# Patient Record
Sex: Female | Born: 1960
Health system: Southern US, Community
[De-identification: ages and names within clinical notes are randomized; demographics above are authoritative.]

## PROBLEM LIST (undated history)

## (undated) DIAGNOSIS — R002 Palpitations: Secondary | ICD-10-CM

## (undated) DIAGNOSIS — IMO0001 Reserved for inherently not codable concepts without codable children: Secondary | ICD-10-CM

## (undated) DIAGNOSIS — I7300 Raynaud's syndrome without gangrene: Secondary | ICD-10-CM

## (undated) HISTORY — DX: Reserved for inherently not codable concepts without codable children: IMO0001

## (undated) HISTORY — PX: OTHER SURGICAL HISTORY: SHX169

## (undated) HISTORY — DX: Palpitations: R00.2

## (undated) HISTORY — DX: Raynaud's syndrome without gangrene: I73.00

---

## 1997-06-05 ENCOUNTER — Ambulatory Visit (HOSPITAL_COMMUNITY): Admission: RE | Admit: 1997-06-05 | Discharge: 1997-06-05 | Payer: Self-pay | Admitting: Internal Medicine

## 1997-10-16 ENCOUNTER — Ambulatory Visit: Admission: RE | Admit: 1997-10-16 | Discharge: 1997-10-16 | Payer: Self-pay | Admitting: Internal Medicine

## 1999-06-16 ENCOUNTER — Other Ambulatory Visit: Admission: RE | Admit: 1999-06-16 | Discharge: 1999-06-16 | Payer: Self-pay | Admitting: Internal Medicine

## 1999-09-20 ENCOUNTER — Inpatient Hospital Stay (HOSPITAL_COMMUNITY): Admission: EM | Admit: 1999-09-20 | Discharge: 1999-09-23 | Payer: Self-pay | Admitting: Emergency Medicine

## 2000-11-26 ENCOUNTER — Other Ambulatory Visit: Admission: RE | Admit: 2000-11-26 | Discharge: 2000-11-26 | Payer: Self-pay | Admitting: Internal Medicine

## 2001-09-29 ENCOUNTER — Ambulatory Visit (HOSPITAL_COMMUNITY): Admission: RE | Admit: 2001-09-29 | Discharge: 2001-09-29 | Payer: Self-pay | Admitting: Internal Medicine

## 2001-09-29 ENCOUNTER — Encounter: Payer: Self-pay | Admitting: Internal Medicine

## 2001-12-06 ENCOUNTER — Other Ambulatory Visit: Admission: RE | Admit: 2001-12-06 | Discharge: 2001-12-06 | Payer: Self-pay | Admitting: Internal Medicine

## 2001-12-13 ENCOUNTER — Encounter: Admission: RE | Admit: 2001-12-13 | Discharge: 2001-12-13 | Payer: Self-pay | Admitting: Internal Medicine

## 2001-12-13 ENCOUNTER — Encounter: Payer: Self-pay | Admitting: Internal Medicine

## 2003-05-18 ENCOUNTER — Inpatient Hospital Stay (HOSPITAL_COMMUNITY): Admission: EM | Admit: 2003-05-18 | Discharge: 2003-05-20 | Payer: Self-pay | Admitting: *Deleted

## 2003-05-18 ENCOUNTER — Encounter: Payer: Self-pay | Admitting: Cardiology

## 2003-07-12 ENCOUNTER — Encounter: Admission: RE | Admit: 2003-07-12 | Discharge: 2003-07-12 | Payer: Self-pay | Admitting: Internal Medicine

## 2004-03-04 ENCOUNTER — Ambulatory Visit: Payer: Self-pay | Admitting: Family Medicine

## 2004-03-19 ENCOUNTER — Ambulatory Visit: Payer: Self-pay

## 2004-04-14 ENCOUNTER — Ambulatory Visit: Payer: Self-pay

## 2004-08-18 ENCOUNTER — Ambulatory Visit: Payer: Self-pay | Admitting: Internal Medicine

## 2004-09-02 ENCOUNTER — Other Ambulatory Visit: Admission: RE | Admit: 2004-09-02 | Discharge: 2004-09-02 | Payer: Self-pay | Admitting: Internal Medicine

## 2004-09-02 ENCOUNTER — Ambulatory Visit: Payer: Self-pay | Admitting: Internal Medicine

## 2004-10-09 ENCOUNTER — Encounter: Admission: RE | Admit: 2004-10-09 | Discharge: 2004-10-09 | Payer: Self-pay | Admitting: Internal Medicine

## 2004-12-29 ENCOUNTER — Ambulatory Visit: Payer: Self-pay | Admitting: Internal Medicine

## 2005-09-21 ENCOUNTER — Ambulatory Visit: Payer: Self-pay | Admitting: Family Medicine

## 2005-09-25 ENCOUNTER — Ambulatory Visit: Payer: Self-pay | Admitting: Family Medicine

## 2005-10-27 ENCOUNTER — Encounter: Admission: RE | Admit: 2005-10-27 | Discharge: 2005-10-27 | Payer: Self-pay | Admitting: Internal Medicine

## 2005-11-22 IMAGING — CT CT EXTREM LOW BILAT W/ CM
2 of 4 series · 8 of 14 positions shown, 10 images · IV contrast (120 ML OMNI)
Comparison: none

CLINICAL DATA: Chest pain.  Chest tightness.  Presyncopal episode. 
 CT SCAN OF THE CHEST WITH INTRAVENOUS CONTRAST 05/18/03
 Scans were performed following intravenous injection of 120 cc of Omnipaque 300.  
 There is no evidence of pulmonary emboli, infiltrates, effusions, or adenopathy.  The heart size is normal.  No discrete bony abnormality. 
 IMPRESSION
 Negative CT scan of the chest. 
 CT SCAN OF THE LOWER EXTREMITIES WITH CONTRAST 05/18/03
 There is no evidence of deep venous thrombosis or other significant abnormality.  
 Negative CT scan of the lower extremities.

[Series 3: pe w/ lower ext · axial · 0.70mm/px · z∈[-223,-36]mm · 6 of 210 slices shown, 8 images]
[im 30/210  soft-tissue]
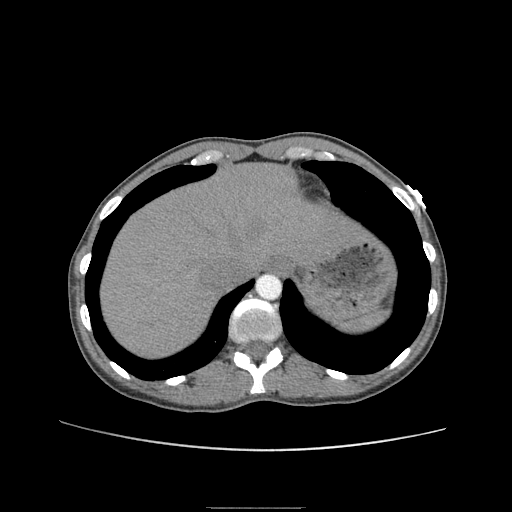
[im 30/210  bone]
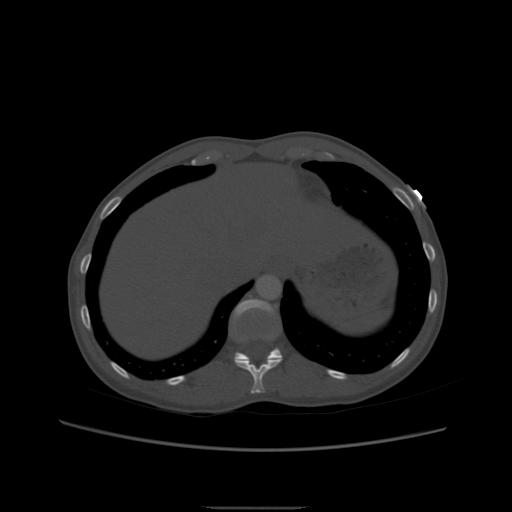
[im 60/210  bone]
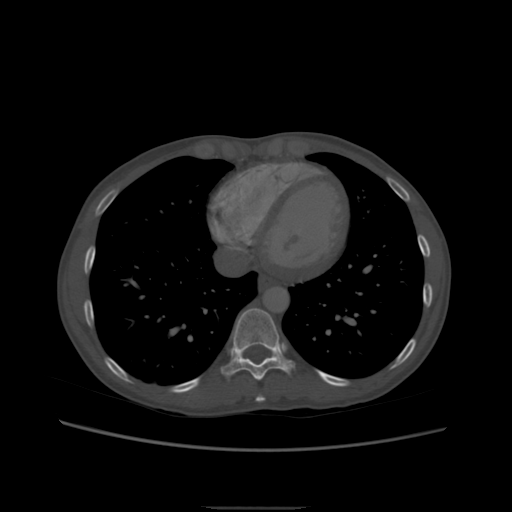
[im 90/210  bone]
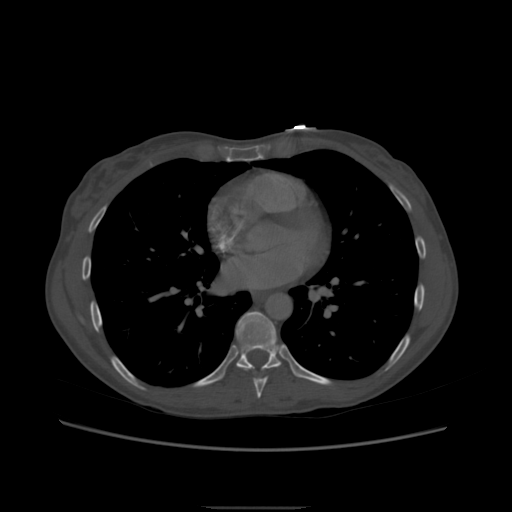
[im 120/210  bone]
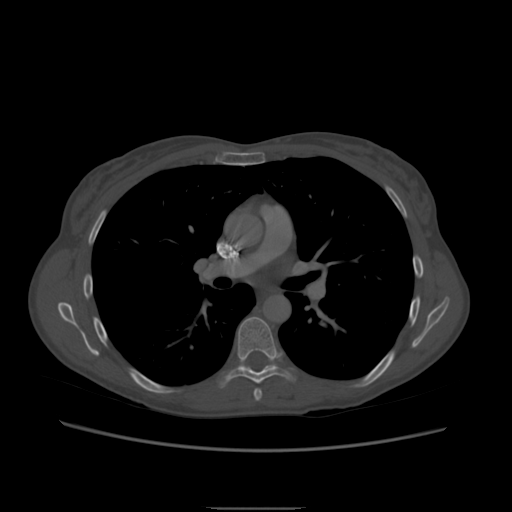
[im 150/210  soft-tissue]
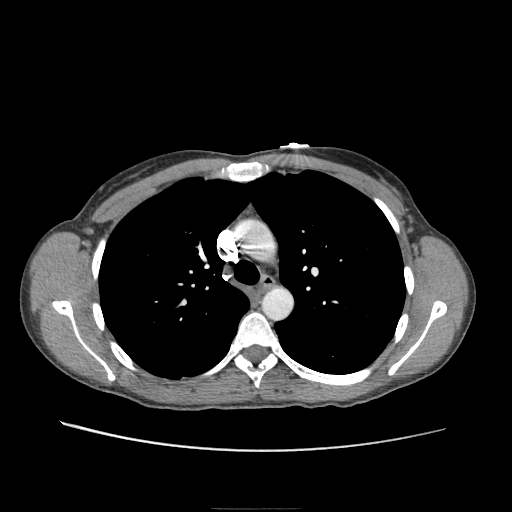
[im 150/210  bone]
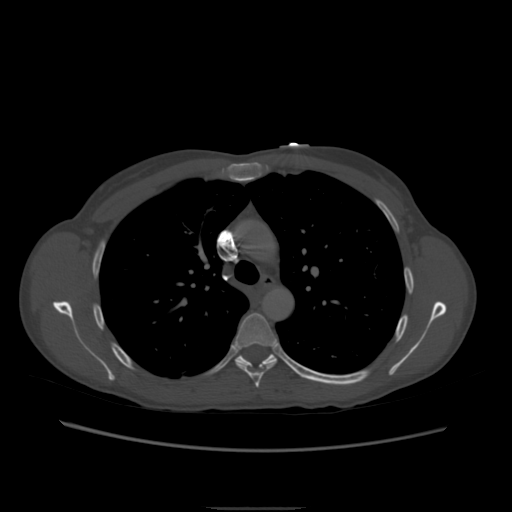
[im 180/210  bone]
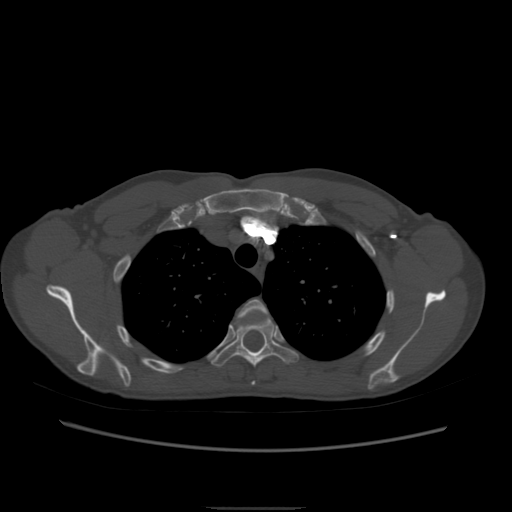

[Series 4: recon 2: pe w/ lower ext · axial · 0.70mm/px · z∈[-173,-86]mm · 2 of 105 slices shown]
[im 35/105  bone]
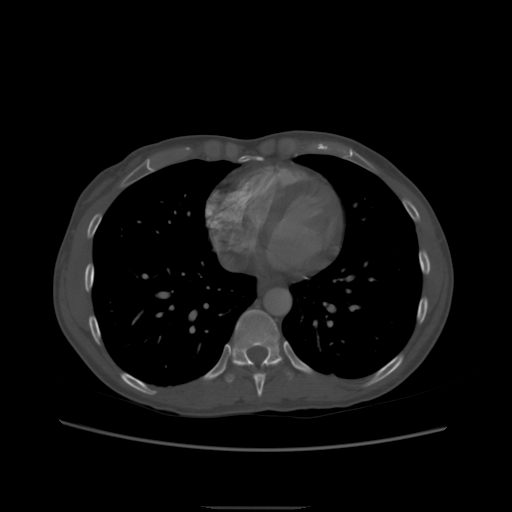
[im 70/105  bone]
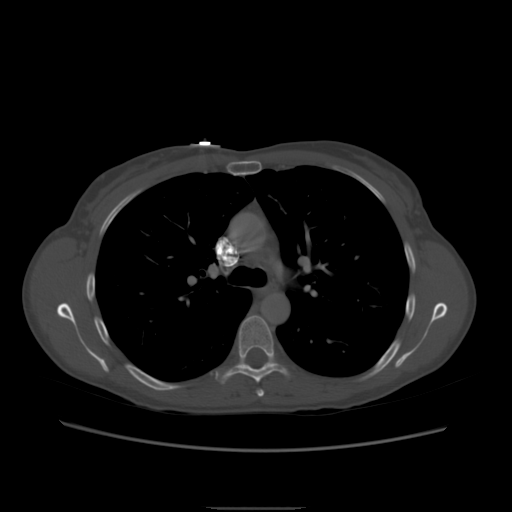

[8 of 14 positions shown; findings below may reference images not displayed]

## 2006-03-31 ENCOUNTER — Ambulatory Visit: Payer: Self-pay | Admitting: Internal Medicine

## 2006-03-31 LAB — CONVERTED CEMR LAB
AST: 24 units/L (ref 0–37)
Alkaline Phosphatase: 47 units/L (ref 39–117)
BUN: 14 mg/dL (ref 6–23)
Calcium: 9.2 mg/dL (ref 8.4–10.5)
Chloride: 108 meq/L (ref 96–112)
Creatinine, Ser: 0.7 mg/dL (ref 0.4–1.2)
Eosinophils Relative: 2.9 % (ref 0.0–5.0)
GFR calc Af Amer: 116 mL/min
HCT: 36.9 % (ref 36.0–46.0)
HDL: 75.4 mg/dL (ref >39.0)
Hemoglobin: 12.6 g/dL (ref 12.0–15.0)
LDL Cholesterol: 103 mg/dL — ABNORMAL HIGH (ref 0–99)
Lymphocytes Relative: 29.4 % (ref 12.0–46.0)
Neutro Abs: 2.2 10*3/uL (ref 1.4–7.7)
Neutrophils Relative %: 54.5 % (ref 43.0–77.0)
Potassium: 4 meq/L (ref 3.5–5.1)
RBC: 4.36 M/uL (ref 3.87–5.11)
Sodium: 140 meq/L (ref 135–145)
TSH: 0.83 microintl units/mL (ref 0.35–5.50)
Total Bilirubin: 0.9 mg/dL (ref 0.3–1.2)
Total CHOL/HDL Ratio: 2.5
Total Protein: 6.3 g/dL (ref 6.0–8.3)
Triglycerides: 54 mg/dL (ref 0–149)
VLDL: 11 mg/dL (ref 0–40)

## 2006-04-28 ENCOUNTER — Ambulatory Visit: Payer: Self-pay | Admitting: Internal Medicine

## 2006-04-28 ENCOUNTER — Encounter: Payer: Self-pay | Admitting: Internal Medicine

## 2006-04-28 ENCOUNTER — Other Ambulatory Visit: Admission: RE | Admit: 2006-04-28 | Discharge: 2006-04-28 | Payer: Self-pay | Admitting: Internal Medicine

## 2006-10-21 DIAGNOSIS — Z860100 Personal history of colon polyps, unspecified: Secondary | ICD-10-CM | POA: Insufficient documentation

## 2006-12-13 ENCOUNTER — Encounter: Admission: RE | Admit: 2006-12-13 | Discharge: 2006-12-13 | Payer: Self-pay | Admitting: Internal Medicine

## 2007-06-13 ENCOUNTER — Ambulatory Visit: Payer: Self-pay | Admitting: Internal Medicine

## 2007-06-13 DIAGNOSIS — L0100 Impetigo, unspecified: Secondary | ICD-10-CM | POA: Insufficient documentation

## 2007-06-13 DIAGNOSIS — L0889 Other specified local infections of the skin and subcutaneous tissue: Secondary | ICD-10-CM | POA: Insufficient documentation

## 2007-06-13 DIAGNOSIS — L255 Unspecified contact dermatitis due to plants, except food: Secondary | ICD-10-CM | POA: Insufficient documentation

## 2007-06-14 ENCOUNTER — Encounter: Payer: Self-pay | Admitting: Internal Medicine

## 2007-06-16 ENCOUNTER — Telehealth: Payer: Self-pay | Admitting: Internal Medicine

## 2007-11-25 ENCOUNTER — Ambulatory Visit: Payer: Self-pay | Admitting: Family Medicine

## 2007-11-25 DIAGNOSIS — R079 Chest pain, unspecified: Secondary | ICD-10-CM | POA: Insufficient documentation

## 2007-11-28 ENCOUNTER — Telehealth: Payer: Self-pay | Admitting: Internal Medicine

## 2007-11-30 ENCOUNTER — Telehealth: Payer: Self-pay | Admitting: Internal Medicine

## 2007-12-09 ENCOUNTER — Telehealth: Payer: Self-pay | Admitting: Family Medicine

## 2007-12-12 ENCOUNTER — Telehealth: Payer: Self-pay | Admitting: *Deleted

## 2007-12-20 ENCOUNTER — Ambulatory Visit: Payer: Self-pay | Admitting: Cardiology

## 2007-12-28 ENCOUNTER — Ambulatory Visit: Payer: Self-pay

## 2007-12-28 ENCOUNTER — Ambulatory Visit: Payer: Self-pay | Admitting: Cardiology

## 2007-12-28 LAB — CONVERTED CEMR LAB
LDL Cholesterol: 108 mg/dL — ABNORMAL HIGH (ref 0–99)
Triglycerides: 39 mg/dL (ref 0–149)
VLDL: 8 mg/dL (ref 0–40)

## 2008-04-05 ENCOUNTER — Encounter: Admission: RE | Admit: 2008-04-05 | Discharge: 2008-04-05 | Payer: Self-pay | Admitting: Internal Medicine

## 2008-05-14 ENCOUNTER — Other Ambulatory Visit: Admission: RE | Admit: 2008-05-14 | Discharge: 2008-05-14 | Payer: Self-pay | Admitting: Family Medicine

## 2009-05-07 ENCOUNTER — Encounter: Admission: RE | Admit: 2009-05-07 | Discharge: 2009-05-07 | Payer: Self-pay | Admitting: Family Medicine

## 2010-02-19 ENCOUNTER — Encounter (INDEPENDENT_AMBULATORY_CARE_PROVIDER_SITE_OTHER): Payer: Self-pay | Admitting: *Deleted

## 2010-03-02 ENCOUNTER — Encounter: Payer: Self-pay | Admitting: Internal Medicine

## 2010-03-09 LAB — CONVERTED CEMR LAB
ALT: 18 units/L (ref 0–35)
AST: 24 units/L (ref 0–37)
Albumin: 4.2 g/dL (ref 3.5–5.2)
Alkaline Phosphatase: 48 units/L (ref 39–117)
Basophils Absolute: 0 10*3/uL (ref 0.0–0.1)
Basophils Relative: 0.5 % (ref 0.0–3.0)
Calcium: 9.5 mg/dL (ref 8.4–10.5)
Glucose, Bld: 85 mg/dL (ref 70–99)
Hemoglobin: 13.4 g/dL (ref 12.0–15.0)
Lymphocytes Relative: 20.7 % (ref 12.0–46.0)
MCHC: 34.2 g/dL (ref 30.0–36.0)
MCV: 88.4 fL (ref 78.0–100.0)
Monocytes Relative: 12.9 % — ABNORMAL HIGH (ref 3.0–12.0)
Platelets: 226 10*3/uL (ref 150–400)
Potassium: 4.9 meq/L (ref 3.5–5.1)
Sodium: 142 meq/L (ref 135–145)
TSH: 0.55 microintl units/mL (ref 0.35–5.50)
WBC: 4 10*3/uL — ABNORMAL LOW (ref 4.5–10.5)

## 2010-03-13 NOTE — Letter (Signed)
Summary: Pre Visit Letter Revised  [facility]  [address] [place], [place] [address]   Phone: [phone]  Fax: [phone]        02/19/2010 MRN: [id number] [facility] [address] [place], [place]  [address]             Procedure Date:  03-26-10   Welcome to the Gastroenterology Division at [facility].    You are scheduled to see a nurse for your pre-procedure visit on 03-12-10 at 8:00a.m. on the 3rd floor at [facility], [address]  We ask that you try to arrive at our office 15 minutes prior to your appointment time to allow for check-in.  Please take a minute to review the attached form.  If you answer "Yes" to one or more of the questions on the first page, we ask that you call the person listed at your earliest opportunity.  If you answer "No" to all of the questions, please complete the rest of the form and bring it to your appointment.    Your nurse visit will consist of discussing your medical and surgical history, your immediate family medical history, and your medications.   If you are unable to list all of your medications on the form, please bring the medication bottles to your appointment and we will list them.  We will need to be aware of both prescribed and over the counter drugs.  We will need to know exact dosage information as well.    Please be prepared to read and sign documents such as consent forms, a financial agreement, and acknowledgement forms.  If necessary, and with your consent, a friend or relative is welcome to sit-in on the nurse visit with you.  Please bring your insurance card so that we may make a copy of it.  If your insurance requires a referral to see a specialist, please bring your referral form from your primary care physician.  No co-pay is required for this nurse visit.     If you cannot keep your appointment, please call [phone] to cancel or reschedule prior to your appointment date.  This  allows [place] the opportunity to schedule an appointment for another patient in need of care.    Thank you for choosing [facility] for your medical needs.  We appreciate the opportunity to care for you.  Please visit [place] at our website  to learn more about our practice.  Sincerely, The Gastroenterology Division

## 2010-03-17 ENCOUNTER — Encounter (INDEPENDENT_AMBULATORY_CARE_PROVIDER_SITE_OTHER): Payer: Self-pay

## 2010-03-18 ENCOUNTER — Encounter: Payer: Self-pay | Admitting: Gastroenterology

## 2010-03-26 ENCOUNTER — Other Ambulatory Visit: Payer: Self-pay | Admitting: Gastroenterology

## 2010-03-27 NOTE — Miscellaneous (Signed)
Summary: Lec previsit  Clinical Lists Changes  Medications: Added new medication of MOVIPREP 100 GM  SOLR (PEG-KCL-NACL-NASULF-NA ASC-C) As per prep instructions. - Signed Rx of MOVIPREP 100 GM  SOLR (PEG-KCL-NACL-NASULF-NA ASC-C) As per prep instructions.;  #1 x 0;  Signed;  Entered by: Ulis Rias RN;  Authorized by: Meryl Dare MD Clementeen Graham;  Method used: Electronically to [facility]*, [address] Shipping/mailing, [place], [place]  [address], Ph: [phone], Fax: [phone] Observations: Added new observation of NKA: T (03/18/2010 8:42)    Prescriptions: MOVIPREP 100 GM  SOLR (PEG-KCL-NACL-NASULF-NA ASC-C) As per prep instructions.  #1 x 0   Entered by:   Ulis Rias RN   Authorized by:   Meryl Dare MD [facility]   Signed by:   Ulis Rias RN on 03/18/2010   Method used:   Electronically to        [facility]* (retail)       [address]       [address] [place]       [place], [place]  [address]       Ph: [phone]       Fax: [phone]   RxID:   [id number]

## 2010-03-27 NOTE — Letter (Signed)
Summary: [facility] Instructions  [facility]  [address] [place], [place] [address]   Phone: [phone]  Fax: [phone]       Brenda Sanders    1960-04-25    MRN: [id number]        Procedure Day Dorna Bloom:  Wednesday  04/02/10     Arrival Time:  2:00pm     Procedure Time:  3:00pm     Location of Procedure:                    _ x_  [facility] (4th Floor)                        PREPARATION FOR COLONOSCOPY WITH MOVIPREP   Starting 5 days prior to your procedure FRIDAY  02/17  do not eat nuts, seeds, popcorn, corn, beans, peas,  salads, or any raw vegetables.  Do not take any fiber supplements (e.g. Metamucil, Citrucel, and Benefiber).  THE DAY BEFORE YOUR PROCEDURE         DATE: Tuesday 2/21  1.  Drink clear liquids the entire day-NO SOLID FOOD  2.  Do not drink anything colored red or purple.  Avoid juices with pulp.  No orange juice.  3.  Drink at least 64 oz. (8 glasses) of fluid/clear liquids during the day to prevent dehydration and help the prep work efficiently.  CLEAR LIQUIDS INCLUDE: Water Jello Ice Popsicles Tea (sugar ok, no milk/cream) Powdered fruit flavored drinks Coffee (sugar ok, no milk/cream) Gatorade Juice: apple, white grape, white cranberry  Lemonade Clear bullion, consomm, broth Carbonated beverages (any kind) Strained chicken noodle soup Hard Candy                             4.  In the morning, mix first dose of MoviPrep solution:    Empty 1 Pouch A and 1 Pouch B into the disposable container    Add lukewarm drinking water to the top line of the container. Mix to dissolve    Refrigerate (mixed solution should be used within 24 hrs)  5.  Begin drinking the prep at 5:00 p.m. The MoviPrep container is divided by 4 marks.   Every 15 minutes drink the solution down to the next mark (approximately 8 oz) until the full liter is complete.   6.  Follow completed prep with 16 oz of clear liquid of your choice (Nothing  red or purple).  Continue to drink clear liquids until bedtime.  7.  Before going to bed, mix second dose of MoviPrep solution:    Empty 1 Pouch A and 1 Pouch B into the disposable container    Add lukewarm drinking water to the top line of the container. Mix to dissolve    Refrigerate  THE DAY OF YOUR PROCEDURE      DATE: Wednesday 2/22  Beginning at 10:00 a.m. (5 hours before procedure):         1. Every 15 minutes, drink the solution down to the next mark (approx 8 oz) until the full liter is complete.  2. Follow completed prep with 16 oz. of clear liquid of your choice.    3. You may drink clear liquids until 1:00pm (2 HOURS BEFORE PROCEDURE).   MEDICATION INSTRUCTIONS  Unless otherwise instructed, you should take regular prescription medications with a small sip of water   as early as possible the morning  of your procedure.         OTHER INSTRUCTIONS  You will need a responsible adult at least 51 years of age to accompany you and drive you home.   This person must remain in the waiting room during your procedure.  Wear loose fitting clothing that is easily removed.  Leave jewelry and other valuables at home.  However, you may wish to bring a book to read or  an iPod/MP3 player to listen to music as you wait for your procedure to start.  Remove all body piercing jewelry and leave at home.  Total time from sign-in until discharge is approximately 2-3 hours.  You should go home directly after your procedure and rest.  You can resume normal activities the  day after your procedure.  The day of your procedure you should not:   Drive   Make legal decisions   Operate machinery   Drink alcohol   Return to work  You will receive specific instructions about eating, activities and medications before you leave.    The above instructions have been reviewed and explained to me by   Ulis Rias RN  March 18, 2010 9:02 AM     I fully understand and can  verbalize these instructions _____________________________ Date _________

## 2010-04-02 ENCOUNTER — Encounter: Payer: Self-pay | Admitting: Gastroenterology

## 2010-04-02 ENCOUNTER — Other Ambulatory Visit (AMBULATORY_SURGERY_CENTER): Payer: Commercial Managed Care - PPO | Admitting: Gastroenterology

## 2010-04-02 DIAGNOSIS — Z1211 Encounter for screening for malignant neoplasm of colon: Secondary | ICD-10-CM

## 2010-04-08 NOTE — Miscellaneous (Signed)
Summary: RX zofran  Clinical Lists Changes  Medications: Added new medication of ZOFRAN ODT 4 MG TBDP (ONDANSETRON) 1 pill under the tongue 4 times a day  as needed for nausea. - Signed Rx of ZOFRAN ODT 4 MG TBDP (ONDANSETRON) 1 pill under the tongue 4 times a day  as needed for nausea.;  #10 x 0;  Signed;  Entered by: Sherren Kerns RN;  Authorized by: Meryl Dare MD [facility];  Method used: [facility]  [place] [address], [place], [place]  [address], Ph: [phone] or [phone], Fax: [phone] Observations: Added new observation of ALLERGY REV: Done (04/02/2010 16:29)    Prescriptions: ZOFRAN ODT 4 MG TBDP (ONDANSETRON) 1 pill under the tongue 4 times a day  as needed for nausea.  #10 x 0   Entered by:   Sherren Kerns RN   Authorized by:   Meryl Dare MD [facility]   Signed by:   Sherren Kerns RN on 04/02/2010   Method used:   [facility]  [place] [address] (retail)       [address]       [place], [place]  [address]       Ph: [phone] or [phone]       Fax: [phone]   RxID:   [id number]

## 2010-04-08 NOTE — Procedures (Signed)
Summary: Colonoscopy  Patient: Brenda Sanders Note: All result statuses are Final unless otherwise noted.  Tests: (1) Colonoscopy (COL)   COL Colonoscopy           DONE     [facility]     [address]     [place], [place]  [address]           COLONOSCOPY PROCEDURE REPORT           PATIENT:  Brenda, Sanders  MR#:  [id number]     BIRTHDATE:  [birth year], 49 yrs. old  GENDER:  female     ENDOSCOPIST:  Judie Petit T. Russella Dar, MD, [facility]     Referred by:  Merri Brunette, M.D.     PROCEDURE DATE:  04/02/2010     PROCEDURE:  Colonoscopy 19[address]     ASA CLASS:  Class I     INDICATIONS:  1) Routine Risk Screening     MEDICATIONS:   Fentanyl 100 mcg IV, Versed 10 mg IV     DESCRIPTION OF PROCEDURE:   After the risks benefits and     alternatives of the procedure were thoroughly explained, informed     consent was obtained.  Digital rectal exam was performed and     revealed no abnormalities.   The LB [id number]-[id number] endoscope     was introduced through the anus and advanced to the cecum, which     was identified by both the appendix and ileocecal valve, limited     by a tortuous colon.    The quality of the prep was excellent,     using MoviPrep.  The instrument was then slowly withdrawn as the     colon was fully examined.     <<PROCEDUREIMAGES>>     FINDINGS:  A normal appearing cecum, ileocecal valve, and     appendiceal orifice were identified. The ascending, hepatic     flexure, transverse, splenic flexure, descending, sigmoid colon,     and rectum appeared unremarkable. Retroflexed views in the rectum     revealed no abnormalities. The time to cecum =  3.25  minutes. The     scope was then withdrawn (time =  9.33  min) from the patient and     the procedure completed.           COMPLICATIONS:  None           ENDOSCOPIC IMPRESSION:     1) Normal colon           RECOMMENDATIONS:     1) Continue current colorectal screening for "routine risk"     patients with a repeat colonoscopy  in 10 years.           Venita Lick. Russella Dar, MD, [facility]een Graham           n.     eSIGNED:   Venita Lick. Parris Signer at 04/02/2010 03:02 PM           Brenda Sanders, [id number]  Note: An exclamation mark (!) indicates a result that was not dispersed into the flowsheet. Document Creation Date: 04/02/2010 3:02 PM _______________________________________________________________________  (1) Order result status: Final Collection or observation date-time: 04/02/2010 14:59 Requested date-time:  Receipt date-time:  Reported date-time:  Referring Physician:   Ordering Physician: Claudette Head [phone]) Specimen Source:  Source: Launa Grill Order Number: [id number] Lab site:   Appended Document: Colonoscopy    Clinical Lists Changes  Observations: Added new observation of COLONNXTDUE: 03/2020 (04/02/2010 16:07)

## 2010-06-24 NOTE — Procedures (Signed)
[facility]                              EXERCISE TREADMILL   Brenda Sanders, Brenda Sanders                        MRN:          [id number]  DATE:12/28/2007                            DOB:          09-09-1960    PROCEDURE:  An exercise treadmill test.   INDICATION:  Chest pain with exertion while running on the treadmill  during her usual exercise regimen.   PROCEDURE NOTE:  The patient exercised using standard Bruce protocol for  total of 14 minutes and 30 seconds.  She reached a maximum heart rate of  164 beats per minute which is 95% of the maximum predicted heart rate.  She did achieve a maximal blood pressure of 154/99.  Her baseline blood  pressure was 129/81.  She did reach stage 5 of the Bruce protocol.  Her  exercise level was 17.20 METS.  On review of the exercise EKG, there  were no ischemic ST-segment changes.  At peak exercise, she did have a  couplet of ventricular premature beats.  Otherwise, there were no  arrhythmias noted.   IMPRESSION:  This is a normal exercise treadmill test.  The patient has  excellent exercise tolerance.  I do not believe that her chest pain was  related to coronary artery disease.  She will follow up as regularly  scheduled with Dr. Fabian Sharp and we will call her with her cholesterol  results.     Marca Ancona, MD  Electronically Signed    DM/MedQ  DD: 12/28/2007  DT: 12/29/2007  Job #: [id number]   cc:   Neta Mends. Fabian Sharp, MD

## 2010-06-24 NOTE — Assessment & Plan Note (Signed)
[facility]                            CARDIOLOGY OFFICE NOTE   Brenda Sanders, Brenda Sanders                        MRN:          [id number]  DATE:12/20/2007                            DOB:          [birth year]    PRIMARY CARE PHYSICIAN:  Neta Mends. Panosh, MD   HISTORY OF PRESENT ILLNESS:  This is a 50 year old with no significant  past medical history, who presents to Cardiology Clinic for evaluation  of chest pain with exertion.  The patient states that 2 weeks ago, she  was running on a treadmill.  She says she was going faster and the heart  at the normal and developed substernal chest pain that resolved when she  got off the treadmill and rested.  The next day, she was riding an  exercise bicycle and was pushing herself hard and she developed a  substernal chest pain that resolved when she stopped the bicycle and  rested.  The following day after that she was running in the park, she  was going quite fast, she developed substernal chest pain and which  resolved when she rested.  She had never had substernal chest pain  before and since that time, she has had no further episodes.  She is  continued to exercise, although she is not sure if she exercised as  intensely as she was on those 3 days that she had the chest discomfort.  She denies any trauma to the chest area, there is no pull of any muscle.  The patient is a nonsmoker, has no history of hypercholesterolemia or  hypertension.  No family history of early onset coronary artery disease.  Back in 2005, the patient was worked up for an episode of presyncope.  She was driving in her car and developed what she felt it was an  irregular heart rhythm and became lightheaded.  She was admitted, had an  echocardiogram done that was unremarkable.  She had a Holter monitor  done that was negative and also she had an exercise Myoview that showed  normal perfusion with no perfusion defects, but latter the exercise  Myoview was actually done in 2006.  The patient is not postmenopausal.  She has had no recent palpitations.  She states she received a flu shot  this year and shortly after the flu shot, she developed feeling of  nausea and achiness and felt lightheaded.  She felt like her heart rate  was running high in the 110s to 120s where she took her pulse.  This  resolved on its own and has not come back again.   PAST MEDICAL HISTORY:  1. Echocardiogram April 2005, EF of 65%, no significant valvular      abnormalities.  2. Holter monitor April 2005 negative for any significant findings.  3. Exercise Myoview in February 2006 was normal with no evidence for      ischemia or infarction.   MEDICATIONS:  Multivitamin and calcium.   SOCIAL HISTORY:  The patient works, the patient is a Engineer, civil (consulting) at [employer].  She is married.  She is not  smoking or use drugs.  She rarely  drinks a glass of wine.  She exercises regularly.   FAMILY HISTORY:  The patient's grandfather had congestive heart failure  and had a myocardial infarction at 61.  Her parents are healthy and her  siblings are healthy.   REVIEW OF SYSTEMS:  Negative except as noted in history of present  illness.   EKG today showed normal sinus rhythm, and is normal EKG.   Labs from October 2009, creatinine 0.8.  LFTs normal.  Hematocrit 39.1.  TSH normal.  LDL from February 2008 is 045.   PHYSICAL EXAMINATION:  VITAL SIGNS:  Blood pressure 100/70, heart rate  62 and regular, and weight is 125 pounds.  GENERAL:  This is a well-developed female in no apparent distress.  HEENT:  Normal.  NEUROLOGIC:  Alert and oriented x3.  Normal affect.  NECK:  There is no thyromegaly.  No thyroid nodule.  There is no JVD.  LUNGS:  Clear to auscultation bilaterally and normal respiratory effort.  CARDIOVASCULAR:  Heart regular, S1 and S2.  No S3.  No S4.  No murmur.  There is no carotid bruit.  ABDOMEN:  Soft and nontender.  No hepatosplenomegaly.  Normal  bowel  sounds.  EXTREMITIES:  There is no peripheral edema, 2+ posterior tibial pulses  bilaterally.  No clubbing or cyanosis.  SKIN:  Normal.  MUSCULOSKELETAL:  Normal.   ASSESSMENT/PLAN:  This is a 50 year old with no significant past medical  history, who presents for evaluation of exertional chest pain.  1. Chest pain.  The patient did have 3 episodes of exertional chest      pain about 2 weeks ago.  They occurred while she was exercising      very vigorously at the gym.  Since that time, she is continued to      exercise, but has had no further chest pain with exertion.  She      states that her chest pain is not related to a meal.  She actually      does not have any risk factors for coronary artery disease, does      not have hypercholesterolemia.  She does not smoke.  She does not      had hypertension.  She does not have a family history and she is      premenopausal.  I think it is very unlikely that she would have      obstructive coronary artery disease, especially given her negative      exercise Myoview back in February 2006.  However, given her      symptoms with exercise that resolved with rest, I do think it is      reasonable to evaluate her further with an exercise treadmill test.      We will go ahead and obtain this in the next week.  2. We will have the patient back to repeat her fasting lipids which      had not been done for about a year.  3. The patient does give a history of some vagal type symptoms after      receiving the flu shot.  She states they are quite severe.  She      feels like her heart rate was high at that time, however, this has      not been repeated since.  I do not think we need at this time to      work this up any further.  I think it would be reasonable for her      not to receive the H1N1 flu vaccination, however, given her      reaction to after the seasonal flu vaccination.     Marca Ancona, MD  Electronically Signed   DM/MedQ   DD: 12/20/2007  DT: 12/21/2007  Job #: [id number]   cc:   Neta Mends. Fabian Sharp, MD

## 2010-06-27 NOTE — Letter (Signed)
January 10, 2008     RE:  KAZUE, CERRO  MRN:  [id number]  /  DOB:  03-13-1960   To whom it may concern,   Jisell Majer has seen me in the clinic for medical care.  She has had a  reaction to the flu shot in the past.  She did have a vagal response  with tachycardia, and she should be excused from the H1N1 flu  vaccination.    Sincerely,      Marca Ancona, MD  Electronically Signed    DM/MedQ  DD: 01/10/2008  DT: 01/11/2008  Job #: [id number]

## 2010-06-27 NOTE — Discharge Summary (Signed)
NAME:  Brenda Sanders, Brenda Sanders                           ACCOUNT NO.:  [id number]   MEDICAL RECORD NO.:  [id number]                   PATIENT TYPE:  INP   LOCATION:  2012                                 FACILITY:  [facility]   PHYSICIAN:  Duke Salvia, M.D.               DATE OF BIRTH:  01-04-1961   DATE OF ADMISSION:  05/18/2003  DATE OF DISCHARGE:  05/20/2003                                 DISCHARGE SUMMARY   REASON FOR ADMISSION:  Presyncope.   HISTORY OF PRESENT ILLNESS:  The patient is a 50 year old woman admitted by  Dr. Felicity Coyer on May 18, 2003. Please refer to the history and physical for  details.   HOSPITAL COURSE:  The patient was admitted, and serial enzymes and other  laboratory studies were negative. Echocardiogram was normal. She had minimal  symptoms during her hospitalization. She was seen in consultation by Dr.  Graciela Husbands of [facility] cardiology, and no cardiac etiology was found.   By May 21, 2003, she was alert and oriented, feeling better, eating her  usual diet, and ambulating and she was thus sent home in good condition.   DISCHARGE DIAGNOSIS:  Lightheadedness episode, etiology uncertain, cardiac  etiology is excluded.   MEDICATIONS:  None.   DIET AND ACTIVITY:  No restriction.   FOLLOW UP:  Dr. Fabian Sharp within two weeks. Focus at followup:  Consider  noncardiac etiology of her symptoms, with the understanding that no etiology  may be found.      Sean A. Everardo All, M.D. [facility]                 Duke Salvia, M.D.    SAE/MEDQ  D:  05/20/2003  T:  05/21/2003  Job:  [id number]   cc:   Neta Mends. Fabian Sharp, M.D. [facility]

## 2010-06-27 NOTE — Discharge Summary (Signed)
[place]  Patient:    Brenda Sanders, Brenda Sanders                          MRN: [id number] Adm. Date:  [id number] Disc. Date: [id number] Attending:  Trauma, Md Dictator:   Eugenia Pancoast, P.A. CC:         Jimmye Norman, M.D.                           Discharge Summary  DATE OF BIRTH:  1960/02/25.  FINAL DIAGNOSIS:  Venomous snake bite.  HISTORY OF PRESENT ILLNESS:  This is a 50 year old female who was walking in sandals when she was bitten by a what she states was a copperhead.  She was seen in the emergency room and noted to have left leg with two puncture wounds at posterior left lower leg just below the ankle. There was normal swelling at that time.  She was subsequently admitted for further treatment.  She was given IV antibiotics.  HOSPITAL COURSE:  The patient was initially given Ancef one dose and then was changed to Rocephin 2 g IV q.24h. until the time of discharge.  The patient had noted increasing swelling over hospital course.  There was some mild blistering at the site of the bite which is normal for a venomous snake bite. Her overall course was moderate.  He did have noted tenderness in the leg and calf area.  Doppler studies were done which revealed a small segment of the calf peroneal branch with DVT.  Otherwise the rest of the examination was negative.  She was started on aspirin 325 mg q.d. and was sent home on this. By September 23, 1999, the leg and calf had become smaller.  There was less edema and she was showing satisfactory improvement. She still had a lot of pain and could not bear weight on that leg.  At this time she was prepared for discharge.  FOLLOW-UP:  She is told to come back to the trauma clinic in one week.  She is otherwise doing well.  DISCHARGE MEDICATIONS:  She was given Vicodin one or two p.o. q.4-6h. p.r.n. for pain.  DISPOSITION:  She was subsequently discharged home in satisfactory and stable condition on September 23, 1999. DD:  09/23/99 TD:  09/24/99 Job: [id number]

## 2010-11-20 ENCOUNTER — Other Ambulatory Visit: Payer: Self-pay | Admitting: Family Medicine

## 2010-11-20 DIAGNOSIS — Z1231 Encounter for screening mammogram for malignant neoplasm of breast: Secondary | ICD-10-CM

## 2010-12-04 ENCOUNTER — Ambulatory Visit
Admission: RE | Admit: 2010-12-04 | Discharge: 2010-12-04 | Disposition: A | Discharge status: Home / Self Care | Payer: Commercial Managed Care - PPO | Source: Ambulatory Visit | Attending: Family Medicine | Admitting: Family Medicine

## 2010-12-04 ENCOUNTER — Ambulatory Visit: Payer: Commercial Managed Care - PPO

## 2010-12-04 DIAGNOSIS — Z1231 Encounter for screening mammogram for malignant neoplasm of breast: Secondary | ICD-10-CM

## 2011-02-25 ENCOUNTER — Telehealth: Payer: Self-pay | Admitting: Cardiology

## 2011-02-25 NOTE — Telephone Encounter (Signed)
New Msg: Pt calling needing MD to fill out section of pt flu exemption form. MD forgot to "check" box that say pt had negative reaction to influenza. Pt needs form completed by Friday. Please return pt call to discuss further.    Pt was made aware that Dr. Shirlee Latch will not be back in the office until 03/03/11.

## 2012-11-22 ENCOUNTER — Other Ambulatory Visit: Payer: Self-pay | Admitting: Family Medicine

## 2012-11-22 ENCOUNTER — Other Ambulatory Visit (HOSPITAL_COMMUNITY)
Admission: RE | Admit: 2012-11-22 | Discharge: 2012-11-22 | Disposition: A | Discharge status: Home / Self Care | Payer: 59 | Source: Ambulatory Visit | Attending: Family Medicine | Admitting: Family Medicine

## 2012-11-22 DIAGNOSIS — Z01419 Encounter for gynecological examination (general) (routine) without abnormal findings: Secondary | ICD-10-CM | POA: Insufficient documentation

## 2013-10-05 ENCOUNTER — Encounter: Payer: Self-pay | Admitting: *Deleted

## 2013-10-05 ENCOUNTER — Encounter (INDEPENDENT_AMBULATORY_CARE_PROVIDER_SITE_OTHER): Payer: BC Managed Care – PPO

## 2013-10-05 ENCOUNTER — Ambulatory Visit (INDEPENDENT_AMBULATORY_CARE_PROVIDER_SITE_OTHER): Payer: BC Managed Care – PPO | Admitting: Cardiovascular Disease

## 2013-10-05 ENCOUNTER — Encounter: Payer: Self-pay | Admitting: Cardiovascular Disease

## 2013-10-05 VITALS — BP 110/72 | HR 73 | Ht 64.5 in | Wt 119.0 lb

## 2013-10-05 DIAGNOSIS — I4949 Other premature depolarization: Secondary | ICD-10-CM

## 2013-10-05 DIAGNOSIS — R002 Palpitations: Secondary | ICD-10-CM

## 2013-10-05 DIAGNOSIS — I493 Ventricular premature depolarization: Secondary | ICD-10-CM

## 2013-10-05 NOTE — Progress Notes (Signed)
History of Present Illness: 53 yo female with history of palpitations, Raynaud's phenomenon who is here today for evaluation of palpitations. She was seen by Dr. Graciela Husbands in 2005 for palpitations. She had been admitted at that time and noted to have PVCs per pt. She had a nuclear stress test and echo at that time that were normal. She wore a monitor and had no arrythmias. She thought that her palpitations occur just before her period. She has had no palpitations for the last 6-7 years. She began noticing her heart racing 6 days ago. She was sitting at lunch with a friend and her heart began to feel irregular. She went to her primary care MD and EKG showed normal sinus rhythm. Over the last 3 days she has noticed palpitations with feeling that her heart is skipping. She has been under much stress. Rare caffeine use.   Primary Care Physician: Azucena Cecil  Last Lipid Profile:Lipid Panel     Component Value Date/Time   CHOL 198 12/28/2007 1214   TRIG 39 12/28/2007 1214   HDL 82.5 12/28/2007 1214   CHOLHDL 2.4 CALC 12/28/2007 1214   VLDL 8 12/28/2007 1214   LDLCALC 108* 12/28/2007 1214    Past Medical History  Diagnosis Date  . Palpitations   . Raynaud's phenomenon     Past Surgical History  Procedure Laterality Date  . None      Current Outpatient Prescriptions  Medication Sig Dispense Refill  . Glucosamine 500 MG CAPS Take by mouth. pt takes occasional      . Omega-3 Fatty Acids (FISH OIL) 1000 MG CAPS Take by mouth. pt takes occasional       No current facility-administered medications for this visit.    Allergies  Allergen Reactions  . Influenza Virus Vac Live Quad     History   Social History  . Marital Status: Married    Spouse Name: N/A    Number of Children: 2  . Years of Education: N/A   Occupational History  . 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 Smoking status: Never Smoker   . Smokeless tobacco: Not on file  . Alcohol Use: 0.5  oz/week    1 drink(s) per week     Comment: 1 glass of wine per week  . Drug Use: No  . Sexual Activity: Not on file   Other Topics Concern  . Not on file   Social History Narrative  . No narrative on file    Family History  Problem Relation Age of Onset  . Atrial fibrillation Sister   . Heart failure Maternal Grandfather   . Heart attack Maternal Aunt     Review of Systems:  As stated in the HPI and otherwise negative.   BP 110/72  Pulse 73  Ht 5' 4.5" (1.638 m)  Wt 119 lb (53.978 kg)  BMI 20.12 kg/m2  SpO2 98%  Physical Examination: General: Well developed, well nourished, NAD HEENT: OP clear, mucus membranes moist SKIN: warm, dry. No rashes. Neuro: No focal deficits Musculoskeletal: Muscle strength 5/5 all ext Psychiatric: Mood and affect normal Neck: No JVD, no carotid bruits, no thyromegaly, no lymphadenopathy. Lungs:Clear bilaterally, no wheezes, rhonci, crackles Cardiovascular: Regular rate and rhythm. No murmurs, gallops or rubs. Abdomen:Soft. Bowel sounds present. Non-tender.  Extremities: No lower extremity edema. Pulses are 2 + in the bilateral DP/PT.  EKG: NSR, rate 62 bpm.   Assessment and Plan:   1. Palpitations/PVCs: She is  known to have PVCs in the past. No symptoms for last 6-7 years but recurrence over the last 6 days. This seems like premature beats. The symptoms only last for a few minutes. Will arrange 48 hour cardiac monitor. Will arrange echo to assess LVEF and exclude structural heart disease.

## 2013-10-05 NOTE — Patient Instructions (Signed)
Your physician recommends that you schedule a follow-up appointment in:  About 4-6 weeks.   Your physician has requested that you have an echocardiogram. Echocardiography is a painless test that uses sound waves to create images of your heart. It provides your doctor with information about the size and shape of your heart and how well your heart's chambers and valves are working. This procedure takes approximately one hour. There are no restrictions for this procedure.   Your physician has recommended that you wear a holter monitor. Holter monitors are medical devices that record the heart's electrical activity. Doctors most often use these monitors to diagnose arrhythmias. Arrhythmias are problems with the speed or rhythm of the heartbeat. The monitor is a small, portable device. You can wear one while you do your normal daily activities. This is usually used to diagnose what is causing palpitations/syncope (passing out).

## 2013-10-05 NOTE — Progress Notes (Signed)
Patient ID: Brenda Sanders, female   DOB: 19-Aug-1960, 53 y.o.   MRN: [id number] E-Cardio 48 hour holter monitor applied to patient.

## 2013-10-09 ENCOUNTER — Ambulatory Visit (HOSPITAL_COMMUNITY): Payer: BC Managed Care – PPO | Attending: Cardiovascular Disease | Admitting: Cardiology

## 2013-10-09 DIAGNOSIS — I493 Ventricular premature depolarization: Secondary | ICD-10-CM

## 2013-10-09 DIAGNOSIS — I4949 Other premature depolarization: Secondary | ICD-10-CM | POA: Diagnosis not present

## 2013-10-09 DIAGNOSIS — R079 Chest pain, unspecified: Secondary | ICD-10-CM

## 2013-10-09 DIAGNOSIS — R002 Palpitations: Secondary | ICD-10-CM | POA: Insufficient documentation

## 2013-10-09 NOTE — Progress Notes (Signed)
Echo performed. 

## 2013-10-17 ENCOUNTER — Telehealth: Payer: Self-pay | Admitting: Cardiovascular Disease

## 2013-10-17 NOTE — Telephone Encounter (Signed)
Report on Dr Gibson Ramp cart waiting for review. Left message to call back

## 2013-10-17 NOTE — Telephone Encounter (Signed)
New message     Patient want monitor results.  Please call and let her know something.

## 2013-10-18 NOTE — Telephone Encounter (Signed)
Spoke with pt and reviewed monitor results with her.  

## 2013-10-18 NOTE — Telephone Encounter (Signed)
Spoke with pt and let her know Dr. Clifton James would review when back in office later today

## 2013-10-30 ENCOUNTER — Ambulatory Visit: Payer: BC Managed Care – PPO | Admitting: Cardiovascular Disease

## 2014-02-16 ENCOUNTER — Encounter: Payer: Self-pay | Admitting: Physician Assistant

## 2014-02-16 ENCOUNTER — Ambulatory Visit (INDEPENDENT_AMBULATORY_CARE_PROVIDER_SITE_OTHER): Payer: BLUE CROSS/BLUE SHIELD

## 2014-02-16 ENCOUNTER — Ambulatory Visit (INDEPENDENT_AMBULATORY_CARE_PROVIDER_SITE_OTHER): Payer: BLUE CROSS/BLUE SHIELD | Admitting: Physician Assistant

## 2014-02-16 VITALS — BP 114/68 | HR 56 | Ht 64.5 in | Wt 126.0 lb

## 2014-02-16 VITALS — BP 120/80 | HR 65 | Resp 18

## 2014-02-16 DIAGNOSIS — I491 Atrial premature depolarization: Secondary | ICD-10-CM

## 2014-02-16 DIAGNOSIS — R002 Palpitations: Secondary | ICD-10-CM

## 2014-02-16 DIAGNOSIS — I7300 Raynaud's syndrome without gangrene: Secondary | ICD-10-CM

## 2014-02-16 DIAGNOSIS — I499 Cardiac arrhythmia, unspecified: Secondary | ICD-10-CM

## 2014-02-16 DIAGNOSIS — I459 Conduction disorder, unspecified: Secondary | ICD-10-CM

## 2014-02-16 DIAGNOSIS — R030 Elevated blood-pressure reading, without diagnosis of hypertension: Secondary | ICD-10-CM

## 2014-02-16 DIAGNOSIS — IMO0001 Reserved for inherently not codable concepts without codable children: Secondary | ICD-10-CM

## 2014-02-16 LAB — BASIC METABOLIC PANEL
BUN: 13 mg/dL (ref 6–23)
CO2: 26 meq/L (ref 19–32)
CREATININE: 0.67 mg/dL (ref 0.50–1.10)
Calcium: 9.5 mg/dL (ref 8.4–10.5)
Chloride: 105 mEq/L (ref 96–112)
GLUCOSE: 87 mg/dL (ref 70–99)
POTASSIUM: 4 meq/L (ref 3.5–5.3)
SODIUM: 139 meq/L (ref 135–145)

## 2014-02-16 LAB — TSH: TSH: 0.825 u[IU]/mL (ref 0.350–4.500)

## 2014-02-16 LAB — MAGNESIUM: Magnesium: 1.8 mg/dL (ref 1.5–2.5)

## 2014-02-16 LAB — T4, FREE: FREE T4: 0.97 ng/dL (ref 0.80–1.80)

## 2014-02-16 NOTE — Progress Notes (Signed)
1.) Reason for visit: Patient complained of high blood pressure at work this am. BP was 154/88 and 150/100 with HR of 90. Patient had some skipped beats early this am before work and later while at work. Patient states, "I feel really shaky, light headed, and tired. I think I am having PAC's."  2.) Name of MD requesting visit: Patient was a walk-in. Her cardiologist is Dr. Clifton JamesMcAlhany. Patient was seen for a nurse visit.  3.) H&P: Patient has a history of palpitations according to Dr. Gibson RampMcAlhany's progress note from last visit on 10/05/2013. Patient has been a Engineer, civil (consulting)nurse for 17 years, and feels stressed at time. Patient exercises regularly and eats healthy.  4.) ROS related to problem: Patient denies any chest pain or SOB. Patient states when her heart skips a beat, she feels and hears a click. Patient seems anxious. Reassured patient that we have no doubt she felt her heart skip, but will evaluate her further with an appointment with Ronie Spiesayna Dunn PA (Flex).  5.) Assessment and plan per MD: Patient's BP on right side was 120/80 and on left side 126/76. Radial pulse was 60. Auscultated apical pulse which was 65 with a regular heart rhythm. [facility] order for EKG placed. EKG showed normal sinus rhythm with heart rate of 61. Patient will be seen on Dayna Dunn's schedule today at 3:00 pm.

## 2014-02-16 NOTE — Patient Instructions (Signed)
You have an appointment today at 3:00 pm with Ronie Spiesayna Dunn.

## 2014-02-16 NOTE — Patient Instructions (Addendum)
Your physician recommends that you continue on your current medications as directed. Please refer to the Current Medication list given to you today.    LABS TODAY  T4 BMET TSH MAG    FOLLOW UP WITH [facility] OR DUNN OR AVAILABLE APP IN 6 WEEKS    PLEASE CALL OFFICE RESULTS ABOUT [employer] APP READINGS IF YOU HAVE ANY REOCCURRING SYMPTOMS

## 2014-02-16 NOTE — Addendum Note (Signed)
Addended by: Laurann MontanaUNN, Zalmen Wrightsman N on: 02/16/2014 05:33 PM   Modules accepted: Level of Service

## 2014-02-16 NOTE — Progress Notes (Signed)
[address] [place], [place]  [address] Phone: [phone] Fax:  [phone]  Date:  02/16/2014   Patient ID:  Brenda, Sanders May 25, 1960, MRN [id number]   PCP:  Allean Found, MD  Cardiologist: Dr. Clifton James  History of Present Illness: Brenda Sanders is a 54 y.o. 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 She presented for evaluation of palpitations. She had normal stress test in 2006. She was remotely evaluated by Dr. Graciela Husbands for palpitations that tended to occur the week before her period. She has since started menopause. She saw Dr. Clifton James in 09/2013 for recurrence of palpitations and wore a Holter that showed NSR with occasional PACs as well as PAC couplets. 2D echo 10/09/13: normal systolic function, EF 60-65%, no RWMA or significant valvular disease. She exercises regularly without any functional limitation, chest pain or dyspnea. This morning she began feeling a sensation of palpitations that was similar to what she experienced in August. She felt shaky, like she was "not perfusing." She described the palpitations like a "click." If she coughed, she would feel the palpitations resolve. She went on to work as usual but continued to feel palpitations. A colleague checked and double-checked her BP at 150/100 which was unusual for her. HR was 92 when she usually runs 60s. She did not check to see if it was regular or irregular.  She presented to the clinic as a walk-in for an EKG which showed NSR without ectopy. Symptoms had resolved by that time. She was added onto my schedule for evaluation. She recently cut out her PM Dr. Reino Kent. She drinks 1 cup of coffee daily. She recently started taking fish oil and glucosamine again after a hiatus. She otherwise denies any recent changes to her diet or lifestyle. She drinks rarely and denies any tobacco/ilicits. No LEE, syncope, fever, chills.  She  feels rare palpitations which she recognizes as her PACs, but this episode today was more prolonged. She denies knowledge of overt tachycardia.  Recent Labs: No results found for requested labs within last 365 days.  Wt Readings from Last 3 Encounters:  02/16/14 126 lb (57.153 kg)  10/05/13 119 lb (53.978 kg)  11/25/07 122 lb (55.339 kg)     Past Medical History  Diagnosis Date  . Palpitations     a. Holter 09/2013: occasional PACs, brief run of PACs close together (normal beat-2PACs-normal-1PAC).  . Raynaud's phenomenon     Current Outpatient Prescriptions  Medication Sig Dispense Refill  . Glucosamine 500 MG CAPS Take by mouth. pt takes occasional    . Omega-3 Fatty Acids (FISH OIL) 1000 MG CAPS Take by mouth. pt takes occasional     No current facility-administered medications for this visit.    Allergies:   Influenza virus vac live quad   Social History:  The patient  reports that she has never smoked. She does not have any smokeless tobacco history on file. She reports that she drinks about 0.5 oz of alcohol per week. She reports that she does not use illicit drugs.   Family History:  The patient's family history includes Atrial fibrillation in her sister; Heart attack in her maternal aunt; Heart failure in her maternal grandfather.  ROS:  Please see the history of present illness.  All other systems reviewed and negative.   PHYSICAL EXAM: VS:  BP 114/68 mmHg  Pulse 56  Ht 5' 4.5" (1.638 m)  Wt 126 lb (57.153  kg)  BMI 21.30 kg/m2 Well nourished, well developed WF, in no acute distress HEENT: normal Neck: no JVD, no carotid bruits Cardiac:  normal S1, S2; RRR; no murmur Lungs:  clear to auscultation bilaterally, no wheezing, rhonchi or rales Abd: soft, nontender, no hepatomegaly Ext: no edema Skin: warm and dry Neuro:  moves all extremities spontaneously, no focal abnormalities noted  EKG:  NSR 61bpm, PR 178ms, QTc 398ms, nonspecific T wave change V2, TWI avL, no  acute change from prior     ASSESSMENT AND PLAN:  1. Palpitations - difficult to know if she was having recurrent ectopy versus a different arrhythmia. However, HR was reportedly not elevated over 100. Will check basic lytes and thyroid function today. We discussed various options including event monitoring, LINQ implantation, and ambulatory monitoring. She is currently in process of changing new insurance and she is not sure what her insurance will cover. Since this kind of episode has been so infrequent, the yield of an event monitor is in question. We discussed the [employer] iphone device which has mobile EKG monitoring. This would allow her to have the ability to capture an EKG quickly without having to wear a monitor. She is very interested in this. She wants to purchase this device rather than proceed with event monitoring. If she captures recurrent symptoms, she will call our office to review the tracings. If she begins to have more frequent symptoms/ectopy, I think low dose PRN propranolol would be a good idea. If she changes her mind about wanting to pursue formal event monitoring instead, she will let us know. She is having no other concerning cardiac symptoms at present time. Return precautions reviewed for worsening symptoms. 2. History of PACs - see above. 3. Transiently elevated blood pressure - she normally runs on the lower side. Suspect only a transient change. Would continue to monitor.  Dispo: F/u 6 weeks with Dr. Clifton JamesMcAlhany or APP.  Signed, Brenda Spiesayna Dunn, PA-C  02/16/2014 4:11 PM

## 2014-04-15 NOTE — Progress Notes (Signed)
History of Present Illness: 54 yo female with history of palpitations, Raynaud's phenomenon who is here today for evaluation of palpitations. She was seen by Dr. Graciela HusbandsKlein in 2005 for palpitations. She had been admitted at that time and noted to have PVCs per pt. She had a nuclear stress test and echo at that time that were normal. She wore a monitor and had no arrythmias. She thought that her palpitations occur just before her period. She had no palpitations for 6-7 years but I saw her in August 2015 and she c/o more frequent palpitations. I arranged an echo and a 48 hour monitor. The echo 10/09/13 showed normal LV function with no valve abnormalities. 48 hour monitor showed rare PACs. She was seen in our office 02/16/13 by Ronie Spiesayna Dunn, PA-C with c/o palpitations and EKG showed sinus with no ectopy.   She is here today for follow up.  She purchased an Education officer, [employer] I-phone monitor and has recorded several episodes which appear to be atrial tachycardia/SVT. She is feeling well overall. Episodes occur once per week.   Primary Care Physician: Azucena CecilSwayne  Past Medical History  Diagnosis Date  . Palpitations     a. Holter 09/2013: occasional PACs, brief run of PACs close together (normal beat-2PACs-normal-1PAC).  . Raynaud's phenomenon   . Normal cardiac stress test     a. Exercise Myoview in February 2006 was normal with no evidence for ischemia or infarction.    Past Surgical History  Procedure Laterality Date  . None      Current Outpatient Prescriptions  Medication Sig Dispense Refill  . Glucosamine 500 MG CAPS Take by mouth. pt takes occasional    . Omega-3 Fatty Acids (FISH OIL) 1000 MG CAPS Take by mouth. pt takes occasional     No current facility-administered medications for this visit.    Allergies  Allergen Reactions  . Influenza Virus Vac Live Quad     Vagal response     History   Social History  . Marital Status: Married    Spouse Name: N/A  . Number of Children: 2  . 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 Smoking status: Never Smoker   . Smokeless tobacco: Not on file  . Alcohol Use: 0.5 oz/week    1 drink(s) per week     Comment: 1 glass of wine per week  . Drug Use: No  . Sexual Activity: Not on file   Other Topics Concern  . Not on file   Social History Narrative    Family History  Problem Relation Age of Onset  . Atrial fibrillation Sister   . Heart failure Maternal Grandfather   . Heart attack Maternal Aunt     In her 7660s    Review of Systems:  As stated in the HPI and otherwise negative.   BP 110/72 mmHg  Pulse 68  Ht 5' 4.5" (1.638 m)  Wt 124 lb (56.246 kg)  BMI 20.96 kg/m2  Physical Examination: General: Well developed, well nourished, NAD HEENT: OP clear, mucus membranes moist SKIN: warm, dry. No rashes. Neuro: No focal deficits Musculoskeletal: Muscle strength 5/5 all ext Psychiatric: Mood and affect normal Neck: No JVD, no carotid bruits, no thyromegaly, no lymphadenopathy. Lungs:Clear bilaterally, no wheezes, rhonci, crackles Cardiovascular: Regular rate and rhythm. No murmurs, gallops or rubs. Abdomen:Soft. Bowel sounds present. Non-tender.  Extremities: No lower extremity edema. Pulses are 2 +  in the bilateral DP/PT.  Assessment and Plan:   1. Palpitations/PVC/SVT: She is known to have PACs/PVCs in the past. She now appears to have SVT. Episodes only last one minute and occur once every week or two. She does not wish to start daily therapy. Will give her prn Cardizem CD.

## 2014-04-16 ENCOUNTER — Ambulatory Visit (INDEPENDENT_AMBULATORY_CARE_PROVIDER_SITE_OTHER): Payer: BLUE CROSS/BLUE SHIELD | Admitting: Cardiovascular Disease

## 2014-04-16 ENCOUNTER — Encounter: Payer: Self-pay | Admitting: Cardiovascular Disease

## 2014-04-16 VITALS — BP 110/72 | HR 68 | Ht 64.5 in | Wt 124.0 lb

## 2014-04-16 DIAGNOSIS — I471 Supraventricular tachycardia: Secondary | ICD-10-CM

## 2014-04-16 MED ORDER — DILTIAZEM HCL ER COATED BEADS 120 MG PO CP24: ORAL_CAPSULE | ORAL | Status: DC

## 2014-04-16 NOTE — Patient Instructions (Signed)
Your physician wants you to follow-up in:  12 months.  You will receive a reminder letter in the mail two months in advance. If you don't receive a letter, please call our office to schedule the follow-up appointment.  Your physician has recommended you make the following change in your medication: Take Cardizem CD 120 mg daily as needed.

## 2014-05-23 ENCOUNTER — Other Ambulatory Visit: Payer: Self-pay | Admitting: Physician Assistant

## 2014-05-23 ENCOUNTER — Other Ambulatory Visit (HOSPITAL_COMMUNITY)
Admission: RE | Admit: 2014-05-23 | Discharge: 2014-05-23 | Disposition: A | Discharge status: Home / Self Care | Payer: BLUE CROSS/BLUE SHIELD | Source: Ambulatory Visit | Attending: Physician Assistant | Admitting: Physician Assistant

## 2014-05-23 DIAGNOSIS — Z1151 Encounter for screening for human papillomavirus (HPV): Secondary | ICD-10-CM | POA: Insufficient documentation

## 2014-05-23 DIAGNOSIS — Z01419 Encounter for gynecological examination (general) (routine) without abnormal findings: Secondary | ICD-10-CM | POA: Diagnosis not present

## 2014-05-28 LAB — CYTOLOGY - PAP

## 2015-01-07 ENCOUNTER — Ambulatory Visit (INDEPENDENT_AMBULATORY_CARE_PROVIDER_SITE_OTHER): Payer: BLUE CROSS/BLUE SHIELD | Admitting: Sports Medicine

## 2015-01-07 ENCOUNTER — Encounter: Payer: Self-pay | Admitting: Sports Medicine

## 2015-01-07 VITALS — BP 106/68 | Ht 64.0 in | Wt 119.0 lb

## 2015-01-07 DIAGNOSIS — S73192A Other sprain of left hip, initial encounter: Secondary | ICD-10-CM | POA: Diagnosis not present

## 2015-01-07 DIAGNOSIS — S76019A Strain of muscle, fascia and tendon of unspecified hip, initial encounter: Secondary | ICD-10-CM | POA: Insufficient documentation

## 2015-01-07 NOTE — Assessment & Plan Note (Signed)
Please see visit note  Focus on  HEP Arnica gel Stretches  Reck glut med strength in 8 wks

## 2015-01-07 NOTE — Progress Notes (Signed)
Patient ID: Lawson FiscalMary M Sanders, female   DOB: 06-Jun-1960, 54 y.o.   MRN: [id number] Ms. Brenda Sanders is a 54 year old female who presents with a 2 year history of left hip pain. Patient states she was doing 1-2 times a week power yoga when after one of these sessions a couple of years ago, she began to notice having pockets of swelling, mostly over the lateral aspect of her hip. Overtime, she notes this left hip pain is not very painful but just aggravating at times. She notes having some stiffness when she sits for long periods of time and after sleeping but this improves with activity. She also notes weather, such as rain, will make her left hip feel stiff. She notes this left hip discomfort has not limited her activities and is still doing the same amount of exercise with the exercise not aggravating the pain. She notes the left hip pain is worsen with laying on it and is unable to lay on it at night. She does note she has  L4-L5 subluxation, which she has had for years and sees a chiropractor for who does adjustments every 6 weeks. She notes after one of the adjustments her left hip pain slightly did improve. She also notes the chiropractor did hip x-rays a few months ago and noted no arthritis.  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   Physical Exam   BP 106/68 mmHg  Ht 5\' 4"  (1.626 m)  Wt 119 lb (53.978 kg)  BMI 20.42 kg/m2 Constitutional: She appears well-developed and well-nourished. No distress.  Musculoskeletal:   Right hip: Normal. 5/5 R hip strength throughout. She exhibits normal strength and no tenderness.   Left hip: She exhibits normal range of motion but does have decreased strength. L hip strength:glute med 4/5, tensor fascia lata 5/5, glute maximus 5/5, flexion 5/5, internal/external rotation 5/5      Palpation: ttp over greater  trochanter      ROM Hip: Full active and passive bilaterally,FADIR negative, FABER limited to 30 degrees bilaterally - slt tight      Neurological: She exhibits normal muscle tone. DTR normal in patella, achilles, and babinski. Pulses 2+ in DP and PT. Sensation with light touch intact.  On hip ultrasound, left hip did show a small bone spur off the greater trochanter at the attachment of the gluteus medius with an  old appearing gluteus medius microtear with some residual hypoechoic change at insertion on long and short view.. Greater trochanter bursa appeared normal. Other tendons appeared normal.   Assessment/Plan  Ms. Brenda RumpfMary Sanders is an otherwise healthy 54 y.o. with left gluteus medius syndrome: - HEP: patient was instructed to do abduction/adduction exercises daily, once patient feels she has mastered these exercises, patient has been instructed to continue these exercises but to add 2 pounds. - Also instructed to do gluteus maximus stretching  - Will avoid nitroglycerin due to patient's history of migraines  Can try arnica gel - Follow up in early February

## 2015-05-20 DIAGNOSIS — M545 Low back pain: Secondary | ICD-10-CM | POA: Diagnosis not present

## 2015-05-20 DIAGNOSIS — M5137 Other intervertebral disc degeneration, lumbosacral region: Secondary | ICD-10-CM | POA: Diagnosis not present

## 2015-05-20 DIAGNOSIS — M9903 Segmental and somatic dysfunction of lumbar region: Secondary | ICD-10-CM | POA: Diagnosis not present

## 2015-06-17 DIAGNOSIS — Z803 Family history of malignant neoplasm of breast: Secondary | ICD-10-CM | POA: Diagnosis not present

## 2015-06-17 DIAGNOSIS — Z1231 Encounter for screening mammogram for malignant neoplasm of breast: Secondary | ICD-10-CM | POA: Diagnosis not present

## 2015-07-04 DIAGNOSIS — H44001 Unspecified purulent endophthalmitis, right eye: Secondary | ICD-10-CM | POA: Diagnosis not present

## 2015-07-15 DIAGNOSIS — M545 Low back pain: Secondary | ICD-10-CM | POA: Diagnosis not present

## 2015-07-15 DIAGNOSIS — M5137 Other intervertebral disc degeneration, lumbosacral region: Secondary | ICD-10-CM | POA: Diagnosis not present

## 2015-07-15 DIAGNOSIS — M9903 Segmental and somatic dysfunction of lumbar region: Secondary | ICD-10-CM | POA: Diagnosis not present

## 2015-08-12 DIAGNOSIS — M9903 Segmental and somatic dysfunction of lumbar region: Secondary | ICD-10-CM | POA: Diagnosis not present

## 2015-08-12 DIAGNOSIS — M545 Low back pain: Secondary | ICD-10-CM | POA: Diagnosis not present

## 2015-08-12 DIAGNOSIS — M5137 Other intervertebral disc degeneration, lumbosacral region: Secondary | ICD-10-CM | POA: Diagnosis not present

## 2015-08-26 DIAGNOSIS — M5137 Other intervertebral disc degeneration, lumbosacral region: Secondary | ICD-10-CM | POA: Diagnosis not present

## 2015-08-26 DIAGNOSIS — M9903 Segmental and somatic dysfunction of lumbar region: Secondary | ICD-10-CM | POA: Diagnosis not present

## 2015-08-26 DIAGNOSIS — M545 Low back pain: Secondary | ICD-10-CM | POA: Diagnosis not present

## 2015-09-04 DIAGNOSIS — Z86018 Personal history of other benign neoplasm: Secondary | ICD-10-CM | POA: Diagnosis not present

## 2015-09-04 DIAGNOSIS — D225 Melanocytic nevi of trunk: Secondary | ICD-10-CM | POA: Diagnosis not present

## 2015-09-04 DIAGNOSIS — D2272 Melanocytic nevi of left lower limb, including hip: Secondary | ICD-10-CM | POA: Diagnosis not present

## 2015-09-04 DIAGNOSIS — D485 Neoplasm of uncertain behavior of skin: Secondary | ICD-10-CM | POA: Diagnosis not present

## 2015-09-04 DIAGNOSIS — L814 Other melanin hyperpigmentation: Secondary | ICD-10-CM | POA: Diagnosis not present

## 2015-09-04 DIAGNOSIS — L570 Actinic keratosis: Secondary | ICD-10-CM | POA: Diagnosis not present

## 2015-10-02 DIAGNOSIS — H1032 Unspecified acute conjunctivitis, left eye: Secondary | ICD-10-CM | POA: Diagnosis not present

## 2015-10-02 DIAGNOSIS — Z0000 Encounter for general adult medical examination without abnormal findings: Secondary | ICD-10-CM | POA: Diagnosis not present

## 2015-10-17 DIAGNOSIS — M9903 Segmental and somatic dysfunction of lumbar region: Secondary | ICD-10-CM | POA: Diagnosis not present

## 2015-10-17 DIAGNOSIS — M545 Low back pain: Secondary | ICD-10-CM | POA: Diagnosis not present

## 2015-10-17 DIAGNOSIS — M5137 Other intervertebral disc degeneration, lumbosacral region: Secondary | ICD-10-CM | POA: Diagnosis not present

## 2015-10-21 DIAGNOSIS — M8588 Other specified disorders of bone density and structure, other site: Secondary | ICD-10-CM | POA: Diagnosis not present

## 2015-10-21 DIAGNOSIS — Z13820 Encounter for screening for osteoporosis: Secondary | ICD-10-CM | POA: Diagnosis not present

## 2015-10-31 DIAGNOSIS — M5137 Other intervertebral disc degeneration, lumbosacral region: Secondary | ICD-10-CM | POA: Diagnosis not present

## 2015-10-31 DIAGNOSIS — M9903 Segmental and somatic dysfunction of lumbar region: Secondary | ICD-10-CM | POA: Diagnosis not present

## 2015-10-31 DIAGNOSIS — M545 Low back pain: Secondary | ICD-10-CM | POA: Diagnosis not present

## 2016-01-06 DIAGNOSIS — M545 Low back pain: Secondary | ICD-10-CM | POA: Diagnosis not present

## 2016-01-06 DIAGNOSIS — M5137 Other intervertebral disc degeneration, lumbosacral region: Secondary | ICD-10-CM | POA: Diagnosis not present

## 2016-01-06 DIAGNOSIS — M9903 Segmental and somatic dysfunction of lumbar region: Secondary | ICD-10-CM | POA: Diagnosis not present

## 2016-01-30 DIAGNOSIS — M545 Low back pain: Secondary | ICD-10-CM | POA: Diagnosis not present

## 2016-01-30 DIAGNOSIS — M9903 Segmental and somatic dysfunction of lumbar region: Secondary | ICD-10-CM | POA: Diagnosis not present

## 2016-01-30 DIAGNOSIS — M5137 Other intervertebral disc degeneration, lumbosacral region: Secondary | ICD-10-CM | POA: Diagnosis not present

## 2016-02-13 DIAGNOSIS — M545 Low back pain: Secondary | ICD-10-CM | POA: Diagnosis not present

## 2016-02-13 DIAGNOSIS — M5137 Other intervertebral disc degeneration, lumbosacral region: Secondary | ICD-10-CM | POA: Diagnosis not present

## 2016-02-13 DIAGNOSIS — M9903 Segmental and somatic dysfunction of lumbar region: Secondary | ICD-10-CM | POA: Diagnosis not present

## 2016-04-02 DIAGNOSIS — M5137 Other intervertebral disc degeneration, lumbosacral region: Secondary | ICD-10-CM | POA: Diagnosis not present

## 2016-04-02 DIAGNOSIS — M9903 Segmental and somatic dysfunction of lumbar region: Secondary | ICD-10-CM | POA: Diagnosis not present

## 2016-04-02 DIAGNOSIS — M545 Low back pain: Secondary | ICD-10-CM | POA: Diagnosis not present

## 2016-06-01 DIAGNOSIS — M9903 Segmental and somatic dysfunction of lumbar region: Secondary | ICD-10-CM | POA: Diagnosis not present

## 2016-06-01 DIAGNOSIS — M5137 Other intervertebral disc degeneration, lumbosacral region: Secondary | ICD-10-CM | POA: Diagnosis not present

## 2016-06-01 DIAGNOSIS — M545 Low back pain: Secondary | ICD-10-CM | POA: Diagnosis not present

## 2016-06-15 DIAGNOSIS — M9903 Segmental and somatic dysfunction of lumbar region: Secondary | ICD-10-CM | POA: Diagnosis not present

## 2016-06-15 DIAGNOSIS — M5137 Other intervertebral disc degeneration, lumbosacral region: Secondary | ICD-10-CM | POA: Diagnosis not present

## 2016-06-15 DIAGNOSIS — M545 Low back pain: Secondary | ICD-10-CM | POA: Diagnosis not present

## 2016-06-25 DIAGNOSIS — Z1231 Encounter for screening mammogram for malignant neoplasm of breast: Secondary | ICD-10-CM | POA: Diagnosis not present

## 2016-07-24 DIAGNOSIS — M545 Low back pain: Secondary | ICD-10-CM | POA: Diagnosis not present

## 2016-07-24 DIAGNOSIS — M5137 Other intervertebral disc degeneration, lumbosacral region: Secondary | ICD-10-CM | POA: Diagnosis not present

## 2016-07-24 DIAGNOSIS — M9903 Segmental and somatic dysfunction of lumbar region: Secondary | ICD-10-CM | POA: Diagnosis not present

## 2016-08-27 DIAGNOSIS — M9903 Segmental and somatic dysfunction of lumbar region: Secondary | ICD-10-CM | POA: Diagnosis not present

## 2016-08-27 DIAGNOSIS — M5137 Other intervertebral disc degeneration, lumbosacral region: Secondary | ICD-10-CM | POA: Diagnosis not present

## 2016-08-27 DIAGNOSIS — M545 Low back pain: Secondary | ICD-10-CM | POA: Diagnosis not present

## 2016-09-14 DIAGNOSIS — M5137 Other intervertebral disc degeneration, lumbosacral region: Secondary | ICD-10-CM | POA: Diagnosis not present

## 2016-09-14 DIAGNOSIS — M545 Low back pain: Secondary | ICD-10-CM | POA: Diagnosis not present

## 2016-09-14 DIAGNOSIS — M9903 Segmental and somatic dysfunction of lumbar region: Secondary | ICD-10-CM | POA: Diagnosis not present

## 2016-10-20 DIAGNOSIS — E7800 Pure hypercholesterolemia, unspecified: Secondary | ICD-10-CM | POA: Diagnosis not present

## 2016-10-20 DIAGNOSIS — Z111 Encounter for screening for respiratory tuberculosis: Secondary | ICD-10-CM | POA: Diagnosis not present

## 2016-10-20 DIAGNOSIS — Z0000 Encounter for general adult medical examination without abnormal findings: Secondary | ICD-10-CM | POA: Diagnosis not present

## 2016-10-27 DIAGNOSIS — M545 Low back pain: Secondary | ICD-10-CM | POA: Diagnosis not present

## 2016-10-27 DIAGNOSIS — M5137 Other intervertebral disc degeneration, lumbosacral region: Secondary | ICD-10-CM | POA: Diagnosis not present

## 2016-10-27 DIAGNOSIS — M9903 Segmental and somatic dysfunction of lumbar region: Secondary | ICD-10-CM | POA: Diagnosis not present

## 2016-11-16 DIAGNOSIS — S0501XA Injury of conjunctiva and corneal abrasion without foreign body, right eye, initial encounter: Secondary | ICD-10-CM | POA: Diagnosis not present

## 2016-11-18 DIAGNOSIS — D2272 Melanocytic nevi of left lower limb, including hip: Secondary | ICD-10-CM | POA: Diagnosis not present

## 2016-11-18 DIAGNOSIS — Z86018 Personal history of other benign neoplasm: Secondary | ICD-10-CM | POA: Diagnosis not present

## 2016-11-18 DIAGNOSIS — D485 Neoplasm of uncertain behavior of skin: Secondary | ICD-10-CM | POA: Diagnosis not present

## 2016-11-18 DIAGNOSIS — C44719 Basal cell carcinoma of skin of left lower limb, including hip: Secondary | ICD-10-CM | POA: Diagnosis not present

## 2016-11-18 DIAGNOSIS — D225 Melanocytic nevi of trunk: Secondary | ICD-10-CM | POA: Diagnosis not present

## 2016-12-14 DIAGNOSIS — M545 Low back pain: Secondary | ICD-10-CM | POA: Diagnosis not present

## 2016-12-14 DIAGNOSIS — M5137 Other intervertebral disc degeneration, lumbosacral region: Secondary | ICD-10-CM | POA: Diagnosis not present

## 2016-12-14 DIAGNOSIS — M9903 Segmental and somatic dysfunction of lumbar region: Secondary | ICD-10-CM | POA: Diagnosis not present

## 2016-12-22 DIAGNOSIS — C44719 Basal cell carcinoma of skin of left lower limb, including hip: Secondary | ICD-10-CM | POA: Diagnosis not present

## 2017-01-25 DIAGNOSIS — Z0184 Encounter for antibody response examination: Secondary | ICD-10-CM | POA: Diagnosis not present

## 2017-01-25 DIAGNOSIS — Z111 Encounter for screening for respiratory tuberculosis: Secondary | ICD-10-CM | POA: Diagnosis not present

## 2017-02-01 DIAGNOSIS — Z111 Encounter for screening for respiratory tuberculosis: Secondary | ICD-10-CM | POA: Diagnosis not present

## 2017-03-08 DIAGNOSIS — M9903 Segmental and somatic dysfunction of lumbar region: Secondary | ICD-10-CM | POA: Diagnosis not present

## 2017-03-08 DIAGNOSIS — M5137 Other intervertebral disc degeneration, lumbosacral region: Secondary | ICD-10-CM | POA: Diagnosis not present

## 2017-03-08 DIAGNOSIS — M545 Low back pain: Secondary | ICD-10-CM | POA: Diagnosis not present

## 2017-05-03 DIAGNOSIS — M5137 Other intervertebral disc degeneration, lumbosacral region: Secondary | ICD-10-CM | POA: Diagnosis not present

## 2017-05-03 DIAGNOSIS — M545 Low back pain: Secondary | ICD-10-CM | POA: Diagnosis not present

## 2017-05-03 DIAGNOSIS — M9903 Segmental and somatic dysfunction of lumbar region: Secondary | ICD-10-CM | POA: Diagnosis not present

## 2017-05-27 DIAGNOSIS — H00012 Hordeolum externum right lower eyelid: Secondary | ICD-10-CM | POA: Diagnosis not present

## 2017-07-19 DIAGNOSIS — H524 Presbyopia: Secondary | ICD-10-CM | POA: Diagnosis not present

## 2017-07-19 DIAGNOSIS — H0012 Chalazion right lower eyelid: Secondary | ICD-10-CM | POA: Diagnosis not present

## 2017-07-19 DIAGNOSIS — H5203 Hypermetropia, bilateral: Secondary | ICD-10-CM | POA: Diagnosis not present

## 2017-08-11 DIAGNOSIS — R002 Palpitations: Secondary | ICD-10-CM | POA: Diagnosis not present

## 2017-08-14 DIAGNOSIS — R002 Palpitations: Secondary | ICD-10-CM | POA: Diagnosis not present

## 2017-10-18 DIAGNOSIS — M5137 Other intervertebral disc degeneration, lumbosacral region: Secondary | ICD-10-CM | POA: Diagnosis not present

## 2017-10-18 DIAGNOSIS — M545 Low back pain: Secondary | ICD-10-CM | POA: Diagnosis not present

## 2017-10-18 DIAGNOSIS — M9903 Segmental and somatic dysfunction of lumbar region: Secondary | ICD-10-CM | POA: Diagnosis not present

## 2017-11-01 ENCOUNTER — Other Ambulatory Visit (HOSPITAL_COMMUNITY)
Admission: RE | Admit: 2017-11-01 | Discharge: 2017-11-01 | Disposition: A | Discharge status: Home / Self Care | Payer: BLUE CROSS/BLUE SHIELD | Source: Ambulatory Visit | Attending: Family Medicine | Admitting: Family Medicine

## 2017-11-01 ENCOUNTER — Other Ambulatory Visit: Payer: Self-pay | Admitting: Family Medicine

## 2017-11-01 DIAGNOSIS — Z1211 Encounter for screening for malignant neoplasm of colon: Secondary | ICD-10-CM | POA: Diagnosis not present

## 2017-11-01 DIAGNOSIS — Z124 Encounter for screening for malignant neoplasm of cervix: Secondary | ICD-10-CM | POA: Diagnosis not present

## 2017-11-01 DIAGNOSIS — Z13220 Encounter for screening for lipoid disorders: Secondary | ICD-10-CM | POA: Diagnosis not present

## 2017-11-01 DIAGNOSIS — Z0000 Encounter for general adult medical examination without abnormal findings: Secondary | ICD-10-CM | POA: Diagnosis not present

## 2017-11-02 LAB — CYTOLOGY - PAP
DIAGNOSIS: NEGATIVE
HPV: NOT DETECTED

## 2017-11-18 DIAGNOSIS — M545 Low back pain: Secondary | ICD-10-CM | POA: Diagnosis not present

## 2017-11-18 DIAGNOSIS — M9903 Segmental and somatic dysfunction of lumbar region: Secondary | ICD-10-CM | POA: Diagnosis not present

## 2017-11-18 DIAGNOSIS — M5137 Other intervertebral disc degeneration, lumbosacral region: Secondary | ICD-10-CM | POA: Diagnosis not present

## 2017-11-24 DIAGNOSIS — D485 Neoplasm of uncertain behavior of skin: Secondary | ICD-10-CM | POA: Diagnosis not present

## 2017-11-24 DIAGNOSIS — Z86018 Personal history of other benign neoplasm: Secondary | ICD-10-CM | POA: Diagnosis not present

## 2017-11-24 DIAGNOSIS — D225 Melanocytic nevi of trunk: Secondary | ICD-10-CM | POA: Diagnosis not present

## 2017-11-24 DIAGNOSIS — D2272 Melanocytic nevi of left lower limb, including hip: Secondary | ICD-10-CM | POA: Diagnosis not present

## 2017-11-24 DIAGNOSIS — L814 Other melanin hyperpigmentation: Secondary | ICD-10-CM | POA: Diagnosis not present

## 2018-01-18 DIAGNOSIS — H1131 Conjunctival hemorrhage, right eye: Secondary | ICD-10-CM | POA: Diagnosis not present

## 2018-01-18 DIAGNOSIS — J0100 Acute maxillary sinusitis, unspecified: Secondary | ICD-10-CM | POA: Diagnosis not present

## 2018-01-20 DIAGNOSIS — M545 Low back pain: Secondary | ICD-10-CM | POA: Diagnosis not present

## 2018-01-20 DIAGNOSIS — M9903 Segmental and somatic dysfunction of lumbar region: Secondary | ICD-10-CM | POA: Diagnosis not present

## 2018-01-20 DIAGNOSIS — M5137 Other intervertebral disc degeneration, lumbosacral region: Secondary | ICD-10-CM | POA: Diagnosis not present

## 2018-02-23 DIAGNOSIS — M9903 Segmental and somatic dysfunction of lumbar region: Secondary | ICD-10-CM | POA: Diagnosis not present

## 2018-02-23 DIAGNOSIS — M5137 Other intervertebral disc degeneration, lumbosacral region: Secondary | ICD-10-CM | POA: Diagnosis not present

## 2018-02-23 DIAGNOSIS — M545 Low back pain: Secondary | ICD-10-CM | POA: Diagnosis not present

## 2018-03-28 DIAGNOSIS — Z1231 Encounter for screening mammogram for malignant neoplasm of breast: Secondary | ICD-10-CM | POA: Diagnosis not present

## 2018-04-21 DIAGNOSIS — M5137 Other intervertebral disc degeneration, lumbosacral region: Secondary | ICD-10-CM | POA: Diagnosis not present

## 2018-04-21 DIAGNOSIS — M545 Low back pain: Secondary | ICD-10-CM | POA: Diagnosis not present

## 2018-04-21 DIAGNOSIS — M9903 Segmental and somatic dysfunction of lumbar region: Secondary | ICD-10-CM | POA: Diagnosis not present

## 2019-10-12 ENCOUNTER — Telehealth: Payer: Self-pay | Admitting: Cardiovascular Disease

## 2019-10-12 NOTE — Telephone Encounter (Signed)
Will send to triage as pt has not been seen since 04/2014.

## 2019-10-12 NOTE — Telephone Encounter (Signed)
Patient has not been seen since 2016. She states when she did see Dr. Clifton James he wrote her letter to exempt her from the flu vaccine because she had a reaction to it. She wants to know if he still has this letter. Also, she states she is reluctant to get the covid vaccine because of the flu vaccine reaction on top of some other reactions. She wants to know if he would write her an exemption for this vaccine as well. Please advise.

## 2019-10-12 NOTE — Telephone Encounter (Signed)
I spoke with patient. She reports she had a vasovagal response after receiving flu vaccine. She states Dr Clifton James wrote her a letter exempting her from the flu vaccine due to this.  Patient reports she has been exempt from flu vaccine since that time.  She is concerned about taking the covid vaccine and is asking if Dr Clifton James would write exemption letter for her.  I located letter in chart and told patient it was written by Dr Shirlee Latch in 2009. Patient has talked with her PCP about exemption letter but was advised to contact cardiology for this.  I told patient Dr Clifton James could not write letter at this time since patient has not been seen since 2016.  I told patient I would send message to Dr Clifton James to see if he would consider writing letter if patient had follow up appointment in our office.

## 2019-10-13 NOTE — Telephone Encounter (Signed)
Left message to call office

## 2019-10-13 NOTE — Telephone Encounter (Signed)
Brenda Hazel, MD      I cannot write a letter for exemption from the covid vaccine. She really needs to discuss this with primary care if she has a true allergy to the flu vaccine. Brenda Sanders     Pt made aware of the above from Dr. Clifton James. She verbalized understanding.

## 2019-10-13 NOTE — Telephone Encounter (Signed)
I cannot write a letter for exemption from the covid vaccine. She really needs to discuss this with primary care if she has a true allergy to the flu vaccine. Thayer Ohm

## 2019-10-13 NOTE — Telephone Encounter (Signed)
Follow up  ° ° °Patient is returning call.  °

## 2019-11-24 ENCOUNTER — Other Ambulatory Visit (HOSPITAL_COMMUNITY)
Admission: RE | Admit: 2019-11-24 | Discharge: 2019-11-24 | Disposition: A | Discharge status: Home / Self Care | Payer: No Typology Code available for payment source | Source: Ambulatory Visit | Attending: Physician Assistant | Admitting: Physician Assistant

## 2019-11-24 ENCOUNTER — Other Ambulatory Visit: Payer: Self-pay | Admitting: Physician Assistant

## 2019-11-24 DIAGNOSIS — Z124 Encounter for screening for malignant neoplasm of cervix: Secondary | ICD-10-CM | POA: Insufficient documentation

## 2019-11-28 LAB — CYTOLOGY - PAP
Comment: NEGATIVE
Diagnosis: NEGATIVE
High risk HPV: NEGATIVE

## 2020-08-22 ENCOUNTER — Encounter: Payer: Self-pay | Admitting: Gastroenterology

## 2020-09-12 DIAGNOSIS — Z1231 Encounter for screening mammogram for malignant neoplasm of breast: Secondary | ICD-10-CM | POA: Diagnosis not present

## 2020-09-20 DIAGNOSIS — R922 Inconclusive mammogram: Secondary | ICD-10-CM | POA: Diagnosis not present

## 2020-09-20 DIAGNOSIS — R928 Other abnormal and inconclusive findings on diagnostic imaging of breast: Secondary | ICD-10-CM | POA: Diagnosis not present

## 2020-12-02 DIAGNOSIS — Z0000 Encounter for general adult medical examination without abnormal findings: Secondary | ICD-10-CM | POA: Diagnosis not present

## 2020-12-02 DIAGNOSIS — Z1231 Encounter for screening mammogram for malignant neoplasm of breast: Secondary | ICD-10-CM | POA: Diagnosis not present

## 2020-12-02 DIAGNOSIS — Z0001 Encounter for general adult medical examination with abnormal findings: Secondary | ICD-10-CM | POA: Diagnosis not present

## 2020-12-02 DIAGNOSIS — Z13220 Encounter for screening for lipoid disorders: Secondary | ICD-10-CM | POA: Diagnosis not present

## 2020-12-02 DIAGNOSIS — E559 Vitamin D deficiency, unspecified: Secondary | ICD-10-CM | POA: Diagnosis not present

## 2021-03-11 DIAGNOSIS — Z1212 Encounter for screening for malignant neoplasm of rectum: Secondary | ICD-10-CM | POA: Diagnosis not present

## 2021-03-11 DIAGNOSIS — Z1211 Encounter for screening for malignant neoplasm of colon: Secondary | ICD-10-CM | POA: Diagnosis not present

## 2021-06-07 DIAGNOSIS — X58XXXA Exposure to other specified factors, initial encounter: Secondary | ICD-10-CM | POA: Diagnosis not present

## 2021-06-07 DIAGNOSIS — S0502XA Injury of conjunctiva and corneal abrasion without foreign body, left eye, initial encounter: Secondary | ICD-10-CM | POA: Diagnosis not present

## 2021-07-17 DIAGNOSIS — D229 Melanocytic nevi, unspecified: Secondary | ICD-10-CM | POA: Diagnosis not present

## 2021-07-17 DIAGNOSIS — L821 Other seborrheic keratosis: Secondary | ICD-10-CM | POA: Diagnosis not present

## 2021-07-17 DIAGNOSIS — L814 Other melanin hyperpigmentation: Secondary | ICD-10-CM | POA: Diagnosis not present

## 2021-07-17 DIAGNOSIS — L578 Other skin changes due to chronic exposure to nonionizing radiation: Secondary | ICD-10-CM | POA: Diagnosis not present

## 2022-03-20 DIAGNOSIS — Z139 Encounter for screening, unspecified: Secondary | ICD-10-CM | POA: Diagnosis not present

## 2022-03-20 DIAGNOSIS — E559 Vitamin D deficiency, unspecified: Secondary | ICD-10-CM | POA: Diagnosis not present

## 2022-03-20 DIAGNOSIS — R5383 Other fatigue: Secondary | ICD-10-CM | POA: Diagnosis not present

## 2022-05-15 DIAGNOSIS — Z0389 Encounter for observation for other suspected diseases and conditions ruled out: Secondary | ICD-10-CM | POA: Diagnosis not present

## 2022-09-16 DIAGNOSIS — D229 Melanocytic nevi, unspecified: Secondary | ICD-10-CM | POA: Diagnosis not present

## 2022-09-16 DIAGNOSIS — L821 Other seborrheic keratosis: Secondary | ICD-10-CM | POA: Diagnosis not present

## 2022-09-16 DIAGNOSIS — L578 Other skin changes due to chronic exposure to nonionizing radiation: Secondary | ICD-10-CM | POA: Diagnosis not present

## 2022-09-16 DIAGNOSIS — Z86018 Personal history of other benign neoplasm: Secondary | ICD-10-CM | POA: Diagnosis not present

## 2022-09-24 DIAGNOSIS — Z0000 Encounter for general adult medical examination without abnormal findings: Secondary | ICD-10-CM | POA: Diagnosis not present
